# Patient Record
Sex: Male | Born: 1995 | Race: White | Hispanic: No | Marital: Single | Smoking: Current every day smoker
Health system: Southern US, Community
[De-identification: ages and names within clinical notes are randomized; demographics above are authoritative.]

## PROBLEM LIST (undated history)

## (undated) DIAGNOSIS — S060X9A Concussion with loss of consciousness of unspecified duration, initial encounter: Secondary | ICD-10-CM

## (undated) DIAGNOSIS — S060XAA Concussion with loss of consciousness status unknown, initial encounter: Secondary | ICD-10-CM

## (undated) DIAGNOSIS — F419 Anxiety disorder, unspecified: Secondary | ICD-10-CM

---
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
## 2013-07-11 ENCOUNTER — Encounter (HOSPITAL_COMMUNITY): Payer: Self-pay

## 2013-07-11 ENCOUNTER — Emergency Department (HOSPITAL_COMMUNITY): Payer: BC Managed Care – PPO

## 2013-07-11 ENCOUNTER — Emergency Department (HOSPITAL_COMMUNITY)
Admission: EM | Admit: 2013-07-11 | Discharge: 2013-07-12 | Disposition: A | Discharge status: Home / Self Care | Payer: BC Managed Care – PPO | Attending: Emergency Medicine | Admitting: Emergency Medicine

## 2013-07-11 DIAGNOSIS — S0993XA Unspecified injury of face, initial encounter: Secondary | ICD-10-CM | POA: Insufficient documentation

## 2013-07-11 DIAGNOSIS — IMO0002 Reserved for concepts with insufficient information to code with codable children: Secondary | ICD-10-CM | POA: Insufficient documentation

## 2013-07-11 DIAGNOSIS — Y92410 Unspecified street and highway as the place of occurrence of the external cause: Secondary | ICD-10-CM | POA: Insufficient documentation

## 2013-07-11 DIAGNOSIS — Z79899 Other long term (current) drug therapy: Secondary | ICD-10-CM | POA: Insufficient documentation

## 2013-07-11 DIAGNOSIS — T148XXA Other injury of unspecified body region, initial encounter: Secondary | ICD-10-CM

## 2013-07-11 DIAGNOSIS — Y9389 Activity, other specified: Secondary | ICD-10-CM | POA: Insufficient documentation

## 2013-07-11 DIAGNOSIS — S50311A Abrasion of right elbow, initial encounter: Secondary | ICD-10-CM

## 2013-07-11 HISTORY — DX: Concussion with loss of consciousness status unknown, initial encounter: S06.0XAA

## 2013-07-11 HISTORY — DX: Concussion with loss of consciousness of unspecified duration, initial encounter: S06.0X9A

## 2013-07-11 LAB — POCT I-STAT, CHEM 8
BUN: 13 mg/dL (ref 6–23)
Creatinine, Ser: 1.1 mg/dL (ref 0.50–1.35)
Glucose, Bld: 93 mg/dL (ref 70–99)
Hemoglobin: 13.9 g/dL (ref 13.0–17.0)
Potassium: 4.5 mEq/L (ref 3.5–5.1)

## 2013-07-11 MED ORDER — OXYCODONE-ACETAMINOPHEN 5-325 MG PO TABS
1.0000 | ORAL_TABLET | Freq: Once | ORAL | Status: AC
  Administered 2013-07-11: 1 via ORAL
  Filled 2013-07-11: qty 1

## 2013-07-11 NOTE — ED Notes (Signed)
Patient returned from CT and X-ray at this time.
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
## 2013-07-12 MED ORDER — NAPROXEN 250 MG PO TABS: 250.0000 mg | ORAL_TABLET | Freq: Two times a day (BID) | ORAL | Status: DC

## 2013-07-12 MED ORDER — HYDROCODONE-ACETAMINOPHEN 5-325 MG PO TABS: ORAL_TABLET | ORAL | Status: DC

## 2013-07-12 MED ORDER — METHOCARBAMOL 500 MG PO TABS: 1000.0000 mg | ORAL_TABLET | Freq: Four times a day (QID) | ORAL | Status: DC | PRN
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
## 2013-09-07 ENCOUNTER — Encounter (HOSPITAL_COMMUNITY): Payer: Self-pay | Admitting: Emergency Medicine

## 2013-09-07 ENCOUNTER — Emergency Department (HOSPITAL_COMMUNITY)
Admission: EM | Admit: 2013-09-07 | Discharge: 2013-09-07 | Disposition: A | Discharge status: Home / Self Care | Payer: Worker's Compensation | Attending: Emergency Medicine | Admitting: Emergency Medicine

## 2013-09-07 ENCOUNTER — Emergency Department (HOSPITAL_COMMUNITY): Payer: Worker's Compensation

## 2013-09-07 DIAGNOSIS — S61011A Laceration without foreign body of right thumb without damage to nail, initial encounter: Secondary | ICD-10-CM

## 2013-09-07 DIAGNOSIS — F17200 Nicotine dependence, unspecified, uncomplicated: Secondary | ICD-10-CM | POA: Insufficient documentation

## 2013-09-07 DIAGNOSIS — Y9389 Activity, other specified: Secondary | ICD-10-CM | POA: Insufficient documentation

## 2013-09-07 DIAGNOSIS — F411 Generalized anxiety disorder: Secondary | ICD-10-CM | POA: Insufficient documentation

## 2013-09-07 DIAGNOSIS — S61209A Unspecified open wound of unspecified finger without damage to nail, initial encounter: Secondary | ICD-10-CM | POA: Insufficient documentation

## 2013-09-07 DIAGNOSIS — Z79899 Other long term (current) drug therapy: Secondary | ICD-10-CM | POA: Insufficient documentation

## 2013-09-07 DIAGNOSIS — Y929 Unspecified place or not applicable: Secondary | ICD-10-CM | POA: Insufficient documentation

## 2013-09-07 DIAGNOSIS — W260XXA Contact with knife, initial encounter: Secondary | ICD-10-CM | POA: Insufficient documentation

## 2013-09-07 DIAGNOSIS — Z23 Encounter for immunization: Secondary | ICD-10-CM | POA: Insufficient documentation

## 2013-09-07 HISTORY — DX: Anxiety disorder, unspecified: F41.9

## 2013-09-07 MED ORDER — ONDANSETRON HCL 4 MG PO TABS: 4.0000 mg | ORAL_TABLET | Freq: Four times a day (QID) | ORAL | Status: DC

## 2013-09-07 MED ORDER — TETANUS-DIPHTH-ACELL PERTUSSIS 5-2.5-18.5 LF-MCG/0.5 IM SUSP
0.5000 mL | Freq: Once | INTRAMUSCULAR | Status: AC
  Administered 2013-09-07: 0.5 mL via INTRAMUSCULAR
  Filled 2013-09-07: qty 0.5

## 2013-09-07 MED ORDER — ONDANSETRON 4 MG PO TBDP
4.0000 mg | ORAL_TABLET | Freq: Once | ORAL | Status: AC
  Administered 2013-09-07: 4 mg via ORAL
  Filled 2013-09-07: qty 1

## 2013-09-07 MED ORDER — CEPHALEXIN 500 MG PO CAPS: 500.0000 mg | ORAL_CAPSULE | Freq: Four times a day (QID) | ORAL | Status: DC

## 2013-09-07 MED ORDER — ACETAMINOPHEN-CODEINE #3 300-30 MG PO TABS: 1.0000 | ORAL_TABLET | Freq: Four times a day (QID) | ORAL | Status: DC | PRN

## 2013-09-07 NOTE — ED Notes (Signed)
Pt c/o laceration from knife to right thumb; bleeding controlled at present; unknown last TD

## 2013-09-07 NOTE — ED Provider Notes (Signed)
CSN: [id number]     Arrival date & time 09/07/13  1247 History  This chart was scribed for Marlon Pel, PA-C, working with Shelda Jakes, MD by Blanchard Kelch, ED Scribe. This patient was seen in room TR10C/TR10C and the patient's care was started at 1:19 PM.    Chief Complaint  Patient presents with  . Laceration    Patient is a 17 y.o. male presenting with skin laceration. The history is provided by the patient. No language interpreter was used.  Laceration   HPI Comments: Sergio Thompson is a 17 y.o. male who presents to the Emergency Department complaining of a laceration to his right thumb that occurred just prior to arrival. He was cutting meat with a rotisserie cutter and sliced his right thumb. The bleeding is currently controlled. He is complaining of slight numbness and constant pain to the affected finger onset immediately after the laceration occurred. He rates the pain as a 8/10 in severity. He denies any known allergies to medications. He does not know when his last tetanus vaccination was.    Past Medical History  Diagnosis Date  . Concussion   . Anxiety    History reviewed. No pertinent past surgical history. History reviewed. No pertinent family history. History  Substance Use Topics  . Smoking status: Current Every Day Smoker  . Smokeless tobacco: Not on file  . Alcohol Use: No    Review of Systems  Constitutional: Negative for fever and chills.  Musculoskeletal: Negative for gait problem.  Skin:       Positive for laceration.  Neurological: Positive for numbness. Negative for speech difficulty.  Psychiatric/Behavioral: Negative for confusion.  All other systems reviewed and are negative.    Allergies  Review of patient's allergies indicates no known allergies.  Home Medications   Current Outpatient Rx  Name  Route  Sig  Dispense  Refill  . venlafaxine (EFFEXOR) 75 MG tablet   Oral   Take 75 mg by mouth daily.         Marland Kitchen  acetaminophen-codeine (TYLENOL #3) 300-30 MG per tablet   Oral   Take 1-2 tablets by mouth every 6 (six) hours as needed for moderate pain.   15 tablet   0   . cephALEXin (KEFLEX) 500 MG capsule   Oral   Take 1 capsule (500 mg total) by mouth 4 (four) times daily.   40 capsule   0   . ondansetron (ZOFRAN) 4 MG tablet   Oral   Take 1 tablet (4 mg total) by mouth every 6 (six) hours.   12 tablet   0    Triage Vitals: BP 109/69  Pulse 81  Temp(Src) 96.4 F (35.8 C) (Oral)  Resp 18  Ht 6\' 3"  (1.905 m)  Wt 195 lb (88.451 kg)  BMI 24.37 kg/m2  SpO2 96%  Physical Exam  Nursing note and vitals reviewed. Constitutional: He is oriented to person, place, and time. He appears well-developed and well-nourished. No distress.  HENT:  Head: Normocephalic and atraumatic.  Eyes: EOM are normal.  Neck: Neck supple. No tracheal deviation present.  Cardiovascular: Normal rate.   Pulmonary/Chest: Effort normal. No respiratory distress.  Musculoskeletal: Normal range of motion.  Neurological: He is alert and oriented to person, place, and time.  Skin: Skin is warm and dry.  Ventral right distal thumb laceration that is approximately 1 cm in diameter, shaped like a flap. IT is not actively bleeding. Full ROM of the thumb. No finger nail  involvement.   Psychiatric: He has a normal mood and affect. His behavior is normal.    ED Course  Procedures (including critical care time)  DIAGNOSTIC STUDIES: Oxygen Saturation is 96% on room air, adequate by my interpretation.    COORDINATION OF CARE: 1:24 PM -Will order TDaP injection and right thumb x-ray. Patient verbalizes understanding and agrees with treatment plan.   Labs Review Labs Reviewed - No data to display Imaging Review Dg Finger Thumb Right  09/07/2013   CLINICAL DATA:  Distal thumb laceration.  EXAM: RIGHT THUMB 2+V  COMPARISON:  None.  FINDINGS: There is no evidence of fracture or dislocation. There is no evidence of  arthropathy or other focal bone abnormality. Soft tissues are unremarkable  IMPRESSION: Negative.   Electronically Signed   By: Salome Holmes M.D.   On: 09/07/2013 14:04    EKG Interpretation   None       MDM   1. Thumb laceration, right, initial encounter     LACERATION REPAIR Performed by: Dorthula Matas Authorized by: Dorthula Matas Consent: Verbal consent obtained. Risks and benefits: risks, benefits and alternatives were discussed Consent given by: patient Patient identity confirmed: provided demographic data Prepped and Draped in normal sterile fashion Wound explored  Laceration Location: right humb  Laceration Length:  2 cm  No Foreign Bodies seen or palpated  Anesthesia: local infiltration  Local anesthetic: lidocaine 2% without epinephrine  Anesthetic total: 4 ml  Irrigation method: syringe Amount of cleaning: standard  Skin closure: sutures  Number of sutures: 3  Technique: simple interrupted  Patient tolerance: Patient tolerated the procedure well with no immediate complications.   Pt finger covered with gauze. He was given tetanus shot in the ED and abx since he was dealing with food products when lacerated. Referral to hand given. stitches to be removed in 7-10 days.  16 y.o.Antionne Enrique Clayborn's evaluation in the Emergency Department is complete. It has been determined that no acute conditions requiring further emergency intervention are present at this time. The patient/guardian have been advised of the diagnosis and plan. We have discussed signs and symptoms that warrant return to the ED, such as changes or worsening in symptoms.  Vital signs are stable at discharge. Filed Vitals:   09/07/13 1432  BP: 110/73  Pulse: 76  Temp: 97.1 F (36.2 C)  Resp: 18    Patient/guardian has voiced understanding and agreed to follow-up with the PCP or specialist.  I personally performed the services described in this documentation, which was scribed in  my presence. The recorded information has been reviewed and is accurate.   Dorthula Matas, PA-C 09/07/13 1426  Dorthula Matas, PA-C 09/07/13 1439

## 2013-09-08 NOTE — ED Provider Notes (Signed)
Medical screening examination/treatment/procedure(s) were performed by non-physician practitioner and as supervising physician I was immediately available for consultation/collaboration.  EKG Interpretation   None         Shelda Jakes, MD 09/08/13 [phone]

## 2013-09-19 ENCOUNTER — Emergency Department (HOSPITAL_COMMUNITY)
Admission: EM | Admit: 2013-09-19 | Discharge: 2013-09-19 | Discharge status: Absent without leave | Payer: 59 | Source: Home / Self Care
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
## 2014-06-15 ENCOUNTER — Emergency Department (HOSPITAL_COMMUNITY)
Admission: EM | Admit: 2014-06-15 | Discharge: 2014-06-15 | Disposition: A | Discharge status: Home / Self Care | Payer: Worker's Compensation | Attending: Emergency Medicine | Admitting: Emergency Medicine

## 2014-06-15 ENCOUNTER — Encounter (HOSPITAL_COMMUNITY): Payer: Self-pay | Admitting: Emergency Medicine

## 2014-06-15 DIAGNOSIS — Y9279 Other farm location as the place of occurrence of the external cause: Secondary | ICD-10-CM | POA: Insufficient documentation

## 2014-06-15 DIAGNOSIS — F17200 Nicotine dependence, unspecified, uncomplicated: Secondary | ICD-10-CM | POA: Insufficient documentation

## 2014-06-15 DIAGNOSIS — F411 Generalized anxiety disorder: Secondary | ICD-10-CM | POA: Diagnosis not present

## 2014-06-15 DIAGNOSIS — L089 Local infection of the skin and subcutaneous tissue, unspecified: Secondary | ICD-10-CM | POA: Diagnosis not present

## 2014-06-15 DIAGNOSIS — T148 Other injury of unspecified body region: Secondary | ICD-10-CM | POA: Diagnosis not present

## 2014-06-15 DIAGNOSIS — Z792 Long term (current) use of antibiotics: Secondary | ICD-10-CM | POA: Diagnosis not present

## 2014-06-15 DIAGNOSIS — IMO0002 Reserved for concepts with insufficient information to code with codable children: Secondary | ICD-10-CM | POA: Insufficient documentation

## 2014-06-15 DIAGNOSIS — Z87828 Personal history of other (healed) physical injury and trauma: Secondary | ICD-10-CM | POA: Diagnosis not present

## 2014-06-15 DIAGNOSIS — L03113 Cellulitis of right upper limb: Secondary | ICD-10-CM

## 2014-06-15 DIAGNOSIS — Z79899 Other long term (current) drug therapy: Secondary | ICD-10-CM | POA: Diagnosis not present

## 2014-06-15 DIAGNOSIS — W57XXXA Bitten or stung by nonvenomous insect and other nonvenomous arthropods, initial encounter: Principal | ICD-10-CM

## 2014-06-15 DIAGNOSIS — IMO0001 Reserved for inherently not codable concepts without codable children: Secondary | ICD-10-CM | POA: Insufficient documentation

## 2014-06-15 DIAGNOSIS — Y9389 Activity, other specified: Secondary | ICD-10-CM | POA: Insufficient documentation

## 2014-06-15 MED ORDER — CEPHALEXIN 500 MG PO CAPS: 500.0000 mg | ORAL_CAPSULE | Freq: Four times a day (QID) | ORAL | Status: AC

## 2014-06-15 MED ORDER — SULFAMETHOXAZOLE-TRIMETHOPRIM 800-160 MG PO TABS: 1.0000 | ORAL_TABLET | Freq: Two times a day (BID) | ORAL | Status: AC

## 2014-06-15 NOTE — Discharge Instructions (Signed)
Take bactrim and keflex as directed until gone. Return to the ED with worsening or concerning symptoms. Apply warm compresses to the affected area.

## 2014-06-15 NOTE — ED Notes (Signed)
Declined W/C at D/C and was escorted to lobby by RN. 

## 2014-06-15 NOTE — ED Notes (Signed)
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 Swell with redness noted at left lateral forearm and blister with yellow pus noted. Pt thinks it might be a spider bite. Radial pulse and sensation noted.

## 2014-06-15 NOTE — ED Provider Notes (Signed)
CSN: [id number]     Arrival date & time 06/15/14  [id number] History  This chart was scribed for Lolita PatellaKristyn Albert, working with Toy CookeyMegan Docherty, MD found by Elon SpannerGarrett Cook, ED Scribe. This patient was seen in room TR09C/TR09C and the patient's care was started at 7:35 PM.    No chief complaint on file.  The history is provided by the patient. No language interpreter was used.    HPI Comments: Sergio Thompson is a 18 y.o. male who presents to the Emergency Department complaining of an insect bite that occurred 2 days ago on his left forearm.  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  Insect type is unknown.  Patient states that the affected area was initially a single, small, circular, raised red spot that has since worsened and spread to include a circular, flat red area around the initial site of the bite.  Patient states the site is aggravated by heat and sweat.    Past Medical History  Diagnosis Date  . Concussion   . Anxiety    No past surgical history on file. No family history on file. History  Substance Use Topics  . Smoking status: Current Every Day Smoker  . Smokeless tobacco: Not on file  . Alcohol Use: No    Review of Systems  Skin: Positive for wound.  All other systems reviewed and are negative.     Allergies  Review of patient's allergies indicates no known allergies.  Home Medications   Prior to Admission medications   Medication Sig Start Date End Date Taking? Authorizing Provider  acetaminophen-codeine (TYLENOL #3) 300-30 MG per tablet Take 1-2 tablets by mouth every 6 (six) hours as needed for moderate pain. 09/07/13   Tiffany Irine SealG Greene, PA-C  cephALEXin (KEFLEX) 500 MG capsule Take 1 capsule (500 mg total) by mouth 4 (four) times daily. 09/07/13   Tiffany Irine SealG Greene, PA-C  ondansetron (ZOFRAN) 4 MG tablet Take 1 tablet (4 mg total) by mouth every 6 (six) hours. 09/07/13   Tiffany Irine SealG Greene, PA-C  venlafaxine  (EFFEXOR) 75 MG tablet Take 75 mg by mouth daily.    Historical Provider, MD   BP 132/65  Pulse 86  Temp(Src) 98.3 F (36.8 C) (Oral)  SpO2 100% Physical Exam  Nursing note and vitals reviewed. Constitutional: He appears well-developed and well-nourished. No distress.  HENT:  Head: Normocephalic and atraumatic.  Eyes: Conjunctivae are normal.  Cardiovascular: Normal rate and regular rhythm.  Exam reveals no gallop and no friction rub.   No murmur heard. Pulmonary/Chest: Effort normal and breath sounds normal. He has no wheezes. He has no rales. He exhibits no tenderness.  Abdominal: Soft. There is no tenderness.  Musculoskeletal: Normal range of motion.  Neurological: He is alert.  Speech is goal-oriented. Moves limbs without ataxia.   Skin: Skin is warm and dry.  5x3cm area of induration, erythema and tenderness to palpation of volar distal left forearm with central pustule. No fluctuance.   Psychiatric: He has a normal mood and affect. His behavior is normal.    ED Course  Procedures (including critical care time)  DIAGNOSTIC STUDIES: Oxygen Saturation is 100% on RA, normal by my interpretation.    COORDINATION OF CARE:  7:41 PM Will prescribe antibiotics.  Advised patient of proper wound car.  Patient acknowledges and agrees with plan.    Labs Review Labs Reviewed - No data to display  Imaging Review No results  found.   EKG Interpretation None      MDM   Final diagnoses:  Cellulitis of right arm    10:14 PM Patient has cellulitis and will be treated with bactrim and keflex. No streaking or other concerneing symptoms. Vitals stable and patient afebrile.   I personally performed the services described in this documentation, which was scribed in my presence. The recorded information has been reviewed and is accurate.    Emilia Beck, PA-C 06/15/14 2215

## 2014-06-16 NOTE — ED Provider Notes (Signed)
Medical screening examination/treatment/procedure(s) were performed by non-physician practitioner and as supervising physician I was immediately available for consultation/collaboration.  Megan Docherty, MD 06/16/14 2029 

## 2017-06-03 ENCOUNTER — Ambulatory Visit (HOSPITAL_COMMUNITY)
Admission: EM | Admit: 2017-06-03 | Discharge: 2017-06-03 | Disposition: A | Discharge status: Home / Self Care | Payer: PRIVATE HEALTH INSURANCE | Attending: Family Medicine | Admitting: Family Medicine

## 2017-06-03 ENCOUNTER — Encounter (HOSPITAL_COMMUNITY): Payer: Self-pay | Admitting: Emergency Medicine

## 2017-06-03 DIAGNOSIS — M79671 Pain in right foot: Secondary | ICD-10-CM

## 2017-06-03 NOTE — Discharge Instructions (Signed)
Wear boot to protect foot. Take ibuprofen 600-800mg  three times a day, take with food to avoid stomach upset. Ice compress and elevation to reduce swelling. Follow up with orthopedics for further evaluation.
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
## 2017-06-03 NOTE — ED Provider Notes (Signed)
CSN: [id number]     Arrival date & time 06/03/17  1146 History   None    Chief Complaint  Patient presents with  . Foot Pain   (Consider location/radiation/quality/duration/timing/severity/associated sxs/prior Treatment) 21 year old male with history of right foot stress fracture comes in for 3 week history of right foot pain around the same area. He states one year ago, he had noticed pain around the dorsal aspect of the right foot between the second to fourth metatarsal, x-ray done showed a small stress fracture. He denies recent injury to start the pain, but does state  he walks a lot for work, pain started when he stepped up a stair. Denies numbness, tingling. Able to bear weight, though with pain. Has been taking ibuprofen 400 mg 3 times a day, which helped with swelling, but minimal relief for pain.       Past Medical History:  Diagnosis Date  . Anxiety   . Concussion    History reviewed. No pertinent surgical history. No family history on file. Social History  Substance Use Topics  . Smoking status: Current Every Day Smoker    Types: E-cigarettes  . Smokeless tobacco: Never Used  . Alcohol use No    Review of Systems  Reason unable to perform ROS: See HPI as above.    Allergies  Patient has no known allergies.  Home Medications   Prior to Admission medications   Medication Sig Start Date End Date Taking? Authorizing Provider  cephALEXin (KEFLEX) 500 MG capsule Take 1 capsule (500 mg total) by mouth 4 (four) times daily. 06/15/14   Emilia BeckSzekalski, Kaitlyn, PA-C  sulfamethoxazole-trimethoprim (SEPTRA DS) 800-160 MG per tablet Take 1 tablet by mouth every 12 (twelve) hours. 06/15/14   Emilia BeckSzekalski, Kaitlyn, PA-C  venlafaxine (EFFEXOR) 75 MG tablet Take 75 mg by mouth daily.    [provider]   Meds Ordered and Administered this Visit  Medications - No data to display  BP 126/67   Pulse 85   Temp 98.5 F (36.9 C) (Oral)   Resp 16   Ht 6\' 4"  (1.93 m)   Wt 240 lb  (108.9 kg)   SpO2 97%   BMI 29.21 kg/m  No data found.   Physical Exam  Constitutional: He is oriented to person, place, and time. He appears well-developed and well-nourished. No distress.  HENT:  Head: Normocephalic and atraumatic.  Eyes: Pupils are equal, round, and reactive to light. Conjunctivae are normal.  Musculoskeletal:  No tenderness on palpation of the ankles. Full range of motion. Strength normal and equal bilaterally, though with pain on the foot. Sensation intact and equal bilaterally.  Mild swelling around the second to fourth metatarsal. Tenderness on palpation of the dorsal aspect of the right foot, between the second to fourth metatarsal. Sensation intact and equal bilaterally. Pedal pulses 2+ and equal bilaterally. Capillary refill less than 2 seconds.  Neurological: He is alert and oriented to person, place, and time.  Skin: Skin is warm and dry.  Psychiatric: He has a normal mood and affect. His behavior is normal. Judgment normal.    Urgent Care Course     Procedures (including critical care time)  Labs Review Labs Reviewed - No data to display  Imaging Review No results found.        MDM   1. Foot pain, right    Discussed with patient xray may not show stress fractures, and discussed that treatment plan will be the same regardless of xray results. Option to  put in cam walker and referral to orthopedics to do full evaluation of possible stress fractures. Patient agrees to plan. Patient can take ibuprofen 600-800mg  TID for pain and inflammation. Ice compress and elevation. Follow up with orthopedics for further evaluation, resources given to patient.    Belinda FisherYu, Layney Gillson V, PA-C 06/03/17 1245
